# Patient Record
Sex: Male | Born: 1980 | Race: White | Hispanic: No | Marital: Married | Smoking: Never smoker
Health system: Southern US, Community
[De-identification: ages and names within clinical notes are randomized; demographics above are authoritative.]

## PROBLEM LIST (undated history)

## (undated) DIAGNOSIS — Z940 Kidney transplant status: Principal | ICD-10-CM

## (undated) DIAGNOSIS — R809 Proteinuria, unspecified: Secondary | ICD-10-CM

## (undated) DIAGNOSIS — N189 Chronic kidney disease, unspecified: Secondary | ICD-10-CM

## (undated) DIAGNOSIS — H409 Unspecified glaucoma: Secondary | ICD-10-CM

## (undated) DIAGNOSIS — Z8679 Personal history of other diseases of the circulatory system: Secondary | ICD-10-CM

## (undated) DIAGNOSIS — E559 Vitamin D deficiency, unspecified: Secondary | ICD-10-CM

## (undated) DIAGNOSIS — G63 Polyneuropathy in diseases classified elsewhere: Secondary | ICD-10-CM

## (undated) HISTORY — DX: Kidney transplant status: Z94.0

## (undated) HISTORY — DX: Chronic kidney disease, unspecified: N18.9

## (undated) HISTORY — DX: Proteinuria, unspecified: R80.9

## (undated) HISTORY — DX: Polyneuropathy in diseases classified elsewhere: G63

## (undated) HISTORY — DX: Personal history of other diseases of the circulatory system: Z86.79

## (undated) HISTORY — DX: Vitamin D deficiency, unspecified: E55.9

## (undated) HISTORY — DX: Unspecified glaucoma: H40.9

---

## 2017-04-25 ENCOUNTER — Ambulatory Visit (INDEPENDENT_AMBULATORY_CARE_PROVIDER_SITE_OTHER): Payer: BLUE CROSS/BLUE SHIELD | Admitting: Osteopathic Medicine

## 2017-04-25 ENCOUNTER — Encounter: Payer: Self-pay | Admitting: Osteopathic Medicine

## 2017-04-25 VITALS — BP 126/77 | HR 62 | Ht 68.9 in | Wt 175.0 lb

## 2017-04-25 DIAGNOSIS — G63 Polyneuropathy in diseases classified elsewhere: Secondary | ICD-10-CM | POA: Diagnosis not present

## 2017-04-25 DIAGNOSIS — E559 Vitamin D deficiency, unspecified: Secondary | ICD-10-CM | POA: Insufficient documentation

## 2017-04-25 DIAGNOSIS — N189 Chronic kidney disease, unspecified: Secondary | ICD-10-CM | POA: Diagnosis not present

## 2017-04-25 DIAGNOSIS — H409 Unspecified glaucoma: Secondary | ICD-10-CM | POA: Diagnosis not present

## 2017-04-25 DIAGNOSIS — Z940 Kidney transplant status: Secondary | ICD-10-CM

## 2017-04-25 DIAGNOSIS — Z8679 Personal history of other diseases of the circulatory system: Secondary | ICD-10-CM | POA: Diagnosis not present

## 2017-04-25 DIAGNOSIS — R809 Proteinuria, unspecified: Secondary | ICD-10-CM

## 2017-04-25 HISTORY — DX: Chronic kidney disease, unspecified: N18.9

## 2017-04-25 HISTORY — DX: Personal history of other diseases of the circulatory system: Z86.79

## 2017-04-25 HISTORY — DX: Proteinuria, unspecified: R80.9

## 2017-04-25 HISTORY — DX: Vitamin D deficiency, unspecified: E55.9

## 2017-04-25 HISTORY — DX: Unspecified glaucoma: H40.9

## 2017-04-25 HISTORY — DX: Kidney transplant status: Z94.0

## 2017-04-25 MED ORDER — DORZOLAMIDE HCL-TIMOLOL MAL 2-0.5 % OP SOLN: 1.0000 [drp] | Freq: Two times a day (BID) | OPHTHALMIC | 3 refills | Status: DC

## 2017-04-25 NOTE — Progress Notes (Signed)
HPI: Martin Wu is a 36 y.o. male  who presents [name] today, 04/25/17,  for chief complaint of:  Chief Complaint  Patient presents with  . Establish Care    New here to establish - moved from [place], originally from [place] Takes care of wife, Martin RipperClaire.   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   HTN - ok on current medications, no home BP to report, on meds more for renal protection   Hc CKD - last Cr 1.92, gets levels checked every few months.   Hx Glaucoma - has appt w/ ophthalmologist upcoming  (+)FH Colon cancer - mom dx and deceased at age 37    Past medical, surgical, social and family history reviewed: Patient Active Problem List   Diagnosis Date Noted  . 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 History of high blood pressure 04/25/2017  . Proteinuria 04/25/2017  . Vitamin D deficiency 04/25/2017  . Glaucoma of both eyes 04/25/2017  . 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 Social History  Substance Use Topics  . Smoking status: Not on file  . Smokeless tobacco: Not on file  . Alcohol use Not on file   No family history on file.   Current medication list and allergy/intolerance information reviewed:   Current Outpatient Prescriptions  Medication Sig Dispense Refill  . dorzolamide-timolol (COSOPT) 22.3-6.8 MG/ML ophthalmic solution Place 1 drop into both eyes 2 (two) times daily. 10 mL 3  . losartan (COZAAR) 25 MG tablet Take 25 mg by mouth daily.    . mycophenolate (CELLCEPT) 500 MG tablet Take by mouth 2 (two) times daily.    . tacrolimus (PROGRAF) 1 MG capsule Take 2 mg by mouth 2 (two) times daily.    . Vitamin D, Ergocalciferol, (DRISDOL) 50000 units CAPS  capsule Take 50,000 Units by mouth every 7 (seven) days.     No current facility-administered medications for this visit.    Allergies not on file    Review of Systems:  Constitutional:  No  fever, no chills, No recent illness, No unintentional weight changes. No significant fatigue.   HEENT: No  headache, no vision change  Cardiac: No  chest pain, No  pressure, No palpitations  Respiratory:  No  shortness of breath. No  Cough  Gastrointestinal: No  abdominal pain, No  nausea, No  vomiting,  No  blood in stool, No  diarrhea, No  constipation   Musculoskeletal: No new myalgia/arthralgia  Skin: No  Rash  Hem/Onc: No  easy bruising/bleeding  Endocrine: No cold intolerance,  No heat intolerance.  Neurologic: No  weakness, No  dizziness,  Psychiatric: No  concerns with depression, No  concerns with anxiety, No sleep problems, No mood problems  Exam:  BP 126/77   Pulse 62   Ht 5' 8.9" (1.75 m)   Wt 175 lb (79.4 kg)   SpO2 100%   BMI 25.92 kg/m   Constitutional: VS see above. General Appearance: alert, well-developed, well-nourished, NAD  Eyes: Normal lids and conjunctive, non-icteric sclera  Ears, Nose, Mouth, Throat: MMM, Normal external inspection ears/nares/mouth/lips/gums. T  Neck: No masses, trachea midline.   Respiratory: Normal respiratory effort. no wheeze, no rhonchi, no rales  Cardiovascular: S1/S2 normal, no murmur, no rub/gallop auscultated. RRR.  No lower extremity edema.   Musculoskeletal: Gait normal. No clubbing/cyanosis of digits.   Neurological: Normal balance/coordination. No tremor.   Skin: warm, dry, intact.  Psychiatric: Normal judgment/insight. Normal mood and affect. Oriented x3.       ASSESSMENT/PLAN:   Renal transplant recipient - Records requested - Plan: Ambulatory referral to Nephrology, COMPLETE METABOLIC PANEL WITH GFR, Urinalysis, Routine w reflex microscopic, Tacrolimus Level  History of high blood pressure - Plan: COMPLETE  METABOLIC PANEL WITH GFR  Proteinuria, unspecified type - Plan: Urinalysis, Routine w reflex microscopic  Uremic neuropathy (HCC) - previously did well with PT, may request referral in the future  Vitamin D deficiency  Glaucoma of both eyes, unspecified glaucoma type    Patient Instructions  Thanks for coming in today!   Plan:  Nephrology referral is in place  Ophthalmology referral - let us know if you need one!   Labs as directed  Refills should be sent - let us know if any problems       Visit summary with medication list and pertinent instructions was printed for patient to review. All questions at time of visit were answered - patient instructed to contact office with any additional concerns. ER/RTC precautions were reviewed with the patient. Follow-up plan: Return in about 6 months (around 10/26/2017) for [facility] PHYSICAL - sooner if needed! Marland Kitchen

## 2017-04-25 NOTE — Patient Instructions (Signed)
Thanks for coming in today!   Plan:  Nephrology referral is in place  Ophthalmology referral - let us know if you need one!   Labs as directed  Refills should be sent - let us know if any problems

## 2017-05-03 ENCOUNTER — Telehealth: Payer: Self-pay | Admitting: Osteopathic Medicine

## 2017-05-03 DIAGNOSIS — R809 Proteinuria, unspecified: Secondary | ICD-10-CM

## 2017-05-03 DIAGNOSIS — N189 Chronic kidney disease, unspecified: Principal | ICD-10-CM

## 2017-05-03 DIAGNOSIS — Z940 Kidney transplant status: Secondary | ICD-10-CM

## 2017-05-03 DIAGNOSIS — G63 Polyneuropathy in diseases classified elsewhere: Secondary | ICD-10-CM

## 2017-05-03 LAB — COMPLETE METABOLIC PANEL WITH GFR
ALT: 18 U/L (ref 9–46)
AST: 18 U/L (ref 10–40)
Albumin: 4.3 g/dL (ref 3.6–5.1)
Alkaline Phosphatase: 91 U/L (ref 40–115)
BUN: 37 mg/dL — ABNORMAL HIGH (ref 7–25)
CALCIUM: 9.3 mg/dL (ref 8.6–10.3)
CHLORIDE: 104 mmol/L (ref 98–110)
CO2: 20 mmol/L (ref 20–31)
Creat: 2.03 mg/dL — ABNORMAL HIGH (ref 0.60–1.35)
GFR, EST AFRICAN AMERICAN: 47 mL/min — AB (ref >=60)
GFR, EST NON AFRICAN AMERICAN: 41 mL/min — AB (ref >=60)
Glucose, Bld: 76 mg/dL (ref 65–99)
POTASSIUM: 5.4 mmol/L — AB (ref 3.5–5.3)
Sodium: 131 mmol/L — ABNORMAL LOW (ref 135–146)
Total Bilirubin: 0.8 mg/dL (ref 0.2–1.2)
Total Protein: 6.7 g/dL (ref 6.1–8.1)

## 2017-05-03 NOTE — Telephone Encounter (Signed)
Noted! Will work on this to clarify instructions in the future and let patients know what to expect

## 2017-05-03 NOTE — Telephone Encounter (Signed)
Can we call lab for clarification: I ordered these labs standing and should have been set to release automatically. Is that something we aren't able to do? How to we place standing orders so the specimens can actually get collected and processed?

## 2017-05-03 NOTE — Telephone Encounter (Signed)
Standing orders must be released. Sorry.

## 2017-05-04 ENCOUNTER — Other Ambulatory Visit: Payer: Self-pay | Admitting: *Deleted

## 2017-05-04 LAB — URINALYSIS, ROUTINE W REFLEX MICROSCOPIC
BILIRUBIN URINE: NEGATIVE
GLUCOSE, UA: NEGATIVE
HGB URINE DIPSTICK: NEGATIVE
KETONES UR: NEGATIVE
Leukocytes, UA: NEGATIVE
NITRITE: NEGATIVE
PH: 5 (ref 5.0–8.0)
Specific Gravity, Urine: 1.009 (ref 1.001–1.035)

## 2017-05-04 LAB — URINALYSIS, MICROSCOPIC ONLY
BACTERIA UA: NONE SEEN [HPF]
Casts: NONE SEEN [LPF]
Crystals: NONE SEEN [HPF]
SQUAMOUS EPITHELIAL / LPF: NONE SEEN [HPF] (ref <=5)
WBC UA: NONE SEEN WBC/HPF (ref <=5)
Yeast: NONE SEEN [HPF]

## 2017-05-04 LAB — TACROLIMUS LEVEL: TACROLIMUS LVL: 3.5 ng/mL — AB (ref 5.0–20.0)

## 2017-05-04 MED ORDER — MYCOPHENOLATE MOFETIL 500 MG PO TABS: 500.0000 mg | ORAL_TABLET | Freq: Two times a day (BID) | ORAL | 1 refills | Status: DC

## 2017-05-04 MED ORDER — TACROLIMUS 1 MG PO CAPS: 2.0000 mg | ORAL_CAPSULE | Freq: Two times a day (BID) | ORAL | 1 refills | Status: DC

## 2017-05-04 NOTE — Telephone Encounter (Signed)
Martin Wu, this patient dropped off a no card with his medications in the fax number for work with a needed to be sent, did we take care of this?

## 2017-05-05 ENCOUNTER — Telehealth: Payer: Self-pay

## 2017-05-05 MED ORDER — LOSARTAN POTASSIUM 50 MG PO TABS: 25.0000 mg | ORAL_TABLET | Freq: Every day | ORAL | 1 refills | Status: AC

## 2017-05-05 NOTE — Telephone Encounter (Signed)
No need to do the vitamin D, he just got a prescription about 10 days ago. It's meant to last 8 weeks, once a week.

## 2017-05-05 NOTE — Telephone Encounter (Signed)
Patient needs refills on Losartan and Vitamin D. Please advise. Historical Provider.

## 2017-05-08 ENCOUNTER — Other Ambulatory Visit: Payer: Self-pay

## 2017-05-08 MED ORDER — TACROLIMUS 1 MG PO CAPS: 2.0000 mg | ORAL_CAPSULE | Freq: Two times a day (BID) | ORAL | 1 refills | Status: DC

## 2017-05-08 MED ORDER — MYCOPHENOLATE MOFETIL 500 MG PO TABS: 500.0000 mg | ORAL_TABLET | Freq: Two times a day (BID) | ORAL | 1 refills | Status: DC

## 2017-05-09 ENCOUNTER — Other Ambulatory Visit: Payer: Self-pay

## 2017-05-09 MED ORDER — MYCOPHENOLATE MOFETIL 500 MG PO TABS: 500.0000 mg | ORAL_TABLET | Freq: Two times a day (BID) | ORAL | 1 refills | Status: DC

## 2017-05-09 MED ORDER — MYCOPHENOLATE MOFETIL 500 MG PO TABS: 500.0000 mg | ORAL_TABLET | Freq: Two times a day (BID) | ORAL | 0 refills | Status: DC

## 2017-05-09 MED ORDER — TACROLIMUS 1 MG PO CAPS: 2.0000 mg | ORAL_CAPSULE | Freq: Two times a day (BID) | ORAL | 0 refills | Status: DC

## 2017-05-09 MED ORDER — TACROLIMUS 1 MG PO CAPS: 2.0000 mg | ORAL_CAPSULE | Freq: Two times a day (BID) | ORAL | 1 refills | Status: DC

## 2017-05-10 ENCOUNTER — Other Ambulatory Visit: Payer: Self-pay | Admitting: Osteopathic Medicine

## 2017-05-10 DIAGNOSIS — E870 Hyperosmolality and hypernatremia: Secondary | ICD-10-CM

## 2017-05-10 DIAGNOSIS — Z940 Kidney transplant status: Secondary | ICD-10-CM

## 2017-05-10 NOTE — Progress Notes (Signed)
Orders placed per PCP recommendation.

## 2017-05-11 LAB — COMPLETE METABOLIC PANEL WITH GFR
ALT: 16 U/L (ref 9–46)
AST: 17 U/L (ref 10–40)
Albumin: 4.3 g/dL (ref 3.6–5.1)
Alkaline Phosphatase: 89 U/L (ref 40–115)
BILIRUBIN TOTAL: 0.5 mg/dL (ref 0.2–1.2)
BUN: 43 mg/dL — ABNORMAL HIGH (ref 7–25)
CO2: 18 mmol/L — AB (ref 20–31)
Calcium: 9 mg/dL (ref 8.6–10.3)
Chloride: 102 mmol/L (ref 98–110)
Creat: 2.22 mg/dL — ABNORMAL HIGH (ref 0.60–1.35)
GFR, EST AFRICAN AMERICAN: 42 mL/min — AB (ref >=60)
GFR, EST NON AFRICAN AMERICAN: 36 mL/min — AB (ref >=60)
GLUCOSE: 87 mg/dL (ref 65–99)
POTASSIUM: 4.7 mmol/L (ref 3.5–5.3)
SODIUM: 133 mmol/L — AB (ref 135–146)
Total Protein: 6.4 g/dL (ref 6.1–8.1)

## 2017-05-11 LAB — TACROLIMUS LEVEL: Tacrolimus Lvl: 3.8 ng/mL — ABNORMAL LOW (ref 5.0–20.0)

## 2017-05-16 ENCOUNTER — Ambulatory Visit (INDEPENDENT_AMBULATORY_CARE_PROVIDER_SITE_OTHER): Payer: BLUE CROSS/BLUE SHIELD | Admitting: Osteopathic Medicine

## 2017-05-16 ENCOUNTER — Encounter: Payer: Self-pay | Admitting: Osteopathic Medicine

## 2017-05-16 VITALS — BP 144/70 | HR 82 | Resp 18 | Wt 170.0 lb

## 2017-05-16 DIAGNOSIS — R1310 Dysphagia, unspecified: Secondary | ICD-10-CM

## 2017-05-16 DIAGNOSIS — R0989 Other specified symptoms and signs involving the circulatory and respiratory systems: Secondary | ICD-10-CM

## 2017-05-16 DIAGNOSIS — R5383 Other fatigue: Secondary | ICD-10-CM | POA: Diagnosis not present

## 2017-05-16 DIAGNOSIS — F458 Other somatoform disorders: Secondary | ICD-10-CM

## 2017-05-16 DIAGNOSIS — Z940 Kidney transplant status: Secondary | ICD-10-CM | POA: Diagnosis not present

## 2017-05-16 MED ORDER — VITAMIN D (ERGOCALCIFEROL) 1.25 MG (50000 UNIT) PO CAPS: 50000.0000 [IU] | ORAL_CAPSULE | ORAL | 1 refills | Status: AC

## 2017-05-16 MED ORDER — AMOXICILLIN-POT CLAVULANATE 875-125 MG PO TABS: 1.0000 | ORAL_TABLET | Freq: Two times a day (BID) | ORAL | 0 refills | Status: DC

## 2017-05-16 MED ORDER — FLUCONAZOLE 150 MG PO TABS: 150.0000 mg | ORAL_TABLET | Freq: Once | ORAL | 1 refills | Status: AC

## 2017-05-16 NOTE — Progress Notes (Signed)
HPI: Martin Wu is a 36 y.o. male  who presents to [name] today, 05/16/17,  for chief complaint of:  Chief Complaint  Patient presents with  . Follow-up    Concerned about possible transplant reduction  . Sore Throat   Renal transplant patient. I had ordered his routine labs for kidney function monitoring and tacrolimus level as a courtesy prior to upcoming appointment with renal transplant team, however he was not able to get in with renal transplant team for some time, he recently relocated to this area. Currently he is concerned about Tacrolimus levels being below range. He increased dose on his own for a few days but went back down to 2 mg twice a day.  He recently went to the emergency department 05/14/2017 for concern about fatigue. He is concerned about generalized immunosuppression and possible transplant rejection. Creatinine 2.08 at that point, sodium 125.   He does not have an appointment with nephrology until 06/15/2017.  At this point, another concern includes discomfort/pain with swallowing. No mouth or tongue pain, mild dental pain but no gum abnormality. He is concerned about possible thrush down the throat/esophagus.   CT abdomen pelvis without contrast performed in ER: IMPRESSION:  Bilateral native kidneys are markedly atrophic. 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 There is a left pelvic transplant kidney with mild hydronephrosis. No  obstructing calculi. No significant perinephric stranding.   Past medical, surgical, social and family history reviewed: Patient Active Problem List   Diagnosis Date Noted  . 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 History of high blood pressure 04/25/2017  . Proteinuria 04/25/2017  . Vitamin D deficiency 04/25/2017  . Glaucoma of both eyes 04/25/2017  . 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 Social History  Substance Use Topics  . Smoking status: Never Smoker  . Smokeless tobacco: Never Used  . Alcohol use No   No family history on file.   Current medication list and allergy/intolerance information reviewed:   Current Outpatient Prescriptions  Medication Sig Dispense Refill  . dorzolamide-timolol (COSOPT) 22.3-6.8 MG/ML ophthalmic solution Place 1 drop into both eyes 2 (two) times daily. 10 mL 3  . losartan (COZAAR) 50 MG tablet Take 0.5 tablets (25 mg total) by mouth daily. 30 tablet 1  . mycophenolate (CELLCEPT) 500 MG tablet Take 1 tablet (500 mg total) by mouth 2 (two) times daily. 60 tablet 0  . tacrolimus (PROGRAF) 1 MG capsule Take 2 capsules (2 mg total) by mouth 2 (two) times daily. 120 capsule 0  . Vitamin D, Ergocalciferol, (DRISDOL) 50000 units CAPS capsule Take 50,000 Units by mouth every 7 (seven) days.     No current facility-administered medications for this visit.    No Known Allergies    Review of Systems:  Constitutional:  No  fever, no chills, +recent illness, No unintentional weight changes. +significant fatigue.   HEENT: No  headache, no vision change  Cardiac: No  chest pain, No  pressure  Respiratory:  No  shortness of breath. No  Cough  Gastrointestinal: No  abdominal pain, No  nausea, No  vomiting,  No  blood in stool, No  diarrhea, No  constipation   Musculoskeletal: No new myalgia/arthralgia  Skin: No  Rash  Neurologic: No  weakness, No  dizziness   Exam:  BP (!) 144/70   Pulse 82   Resp 18   Wt 170  lb (77.1 kg)   BMI 25.18 kg/m    Constitutional: VS see above. General Appearance: alert, well-developed, well-nourished, NAD  Eyes: Normal lids and conjunctive, non-icteric sclera  Ears, Nose, Mouth, Throat: MMM, Normal external inspection ears/nares/mouth/lips/gums.  Pharynx/tonsils no erythema, no exudate. Nasal mucosa normal.   Neck: No masses, trachea midline. No thyroid enlargement. No  tenderness/mass appreciated. No lymphadenopathy  Respiratory: Normal respiratory effort. no wheeze, no rhonchi, no rales  Cardiovascular: S1/S2 normal, no murmur, no rub/gallop auscultated. RRR. No lower extremity edema.   Musculoskeletal: Gait normal.  Neurological: Normal balance/coordination. No tremor. Skin: warm, dry, intact. No rash/ulcer.  Psychiatric: Normal judgment/insight. Normal mood and affect. Oriented x3.   Recent labs reviewed from here and from emergency department visit, no concerns.  ASSESSMENT/PLAN:   Concerned about possible underlying infection or organ rejection. Based on labs, I have no serious concerns that he really needs management with transplant team especially since tacrolimus levels have been on the low/subtherapeutic side.  Patient has been instructed to call the office where he has an appointment set up to receive transplant care, if they're not able to get him in to see him sooner, he will let me know and I will see if I can call over to get some guidance on adjusting the tacrolimus or other lab orders that may need to be done  Nothing I can see to give great concern for pharyngeal pathology that would explain sore throat/dysphagia/globus symptoms. We'll refer to ENT, patient has been to ENT before for similar symptoms and undergone scope in the office. I think at this point, wouldn't be unreasonable to go ahead and treat for possible strep or fungal infection given his immunocompromise.  Renal transplant recipient - Plan: BASIC METABOLIC PANEL WITH GFR, Epstein-Barr virus VCA antibody panel, CBC with Differential/Platelet  Fatigue, unspecified type - Plan: Epstein-Barr virus VCA antibody panel, TSH  Globus sensation - Plan: Ambulatory referral to ENT  Dysphagia, unspecified type - Plan: Ambulatory referral to ENT    Visit summary with medication list and pertinent instructions was printed for patient to review. All questions at time of visit were  answered - patient instructed to contact office with any additional concerns. ER/RTC precautions were reviewed with the patient. Follow-up plan: Return if symptoms worsen or fail to improve.  Note: Total time spent 25 minutes, greater than 50% of the visit was spent face-to-face counseling and coordinating care for the following: The primary encounter diagnosis was Renal transplant recipient. Diagnoses of Fatigue, unspecified type, Globus sensation, and Dysphagia, unspecified type were also pertinent to this visit.Marland Kitchen

## 2017-05-17 LAB — EPSTEIN-BARR VIRUS VCA ANTIBODY PANEL
EBV NA IgG: 25.7 U/mL — ABNORMAL HIGH
EBV VCA IGG: 232 U/mL — AB

## 2017-05-17 LAB — BASIC METABOLIC PANEL WITH GFR
BUN: 40 mg/dL — AB (ref 7–25)
CHLORIDE: 104 mmol/L (ref 98–110)
CO2: 17 mmol/L — AB (ref 20–32)
Calcium: 9.6 mg/dL (ref 8.6–10.3)
Creat: 1.96 mg/dL — ABNORMAL HIGH (ref 0.60–1.35)
GFR, Est African American: 49 mL/min — ABNORMAL LOW (ref >=60)
GFR, Est Non African American: 42 mL/min — ABNORMAL LOW (ref >=60)
Glucose, Bld: 78 mg/dL (ref 65–99)
POTASSIUM: 4.7 mmol/L (ref 3.5–5.3)
SODIUM: 135 mmol/L (ref 135–146)

## 2017-05-17 LAB — CBC WITH DIFFERENTIAL/PLATELET
Basophils Absolute: 0 cells/uL (ref 0–200)
Basophils Relative: 0 %
EOS PCT: 1 %
Eosinophils Absolute: 65 cells/uL (ref 15–500)
HCT: 36 % — ABNORMAL LOW (ref 38.5–50.0)
Hemoglobin: 11.7 g/dL — ABNORMAL LOW (ref 13.2–17.1)
LYMPHS PCT: 20 %
Lymphs Abs: 1300 cells/uL (ref 850–3900)
MCH: 28 pg (ref 27.0–33.0)
MCHC: 32.5 g/dL (ref 32.0–36.0)
MCV: 86.1 fL (ref 80.0–100.0)
MPV: 10.8 fL (ref 7.5–12.5)
Monocytes Absolute: 520 cells/uL (ref 200–950)
Monocytes Relative: 8 %
NEUTROS PCT: 71 %
Neutro Abs: 4615 cells/uL (ref 1500–7800)
PLATELETS: 187 10*3/uL (ref 140–400)
RBC: 4.18 MIL/uL — AB (ref 4.20–5.80)
RDW: 14.3 % (ref 11.0–15.0)
WBC: 6.5 10*3/uL (ref 3.8–10.8)

## 2017-05-17 LAB — TSH: TSH: 0.61 m[IU]/L (ref 0.40–4.50)

## 2017-05-30 ENCOUNTER — Ambulatory Visit (INDEPENDENT_AMBULATORY_CARE_PROVIDER_SITE_OTHER): Payer: BLUE CROSS/BLUE SHIELD | Admitting: Osteopathic Medicine

## 2017-05-30 ENCOUNTER — Encounter: Payer: Self-pay | Admitting: Osteopathic Medicine

## 2017-05-30 ENCOUNTER — Ambulatory Visit (INDEPENDENT_AMBULATORY_CARE_PROVIDER_SITE_OTHER): Payer: BLUE CROSS/BLUE SHIELD

## 2017-05-30 VITALS — BP 142/92 | HR 70 | Ht 70.0 in | Wt 169.0 lb

## 2017-05-30 DIAGNOSIS — M79675 Pain in left toe(s): Secondary | ICD-10-CM | POA: Diagnosis not present

## 2017-05-30 DIAGNOSIS — S99922A Unspecified injury of left foot, initial encounter: Secondary | ICD-10-CM

## 2017-05-30 DIAGNOSIS — S92425A Nondisplaced fracture of distal phalanx of left great toe, initial encounter for closed fracture: Secondary | ICD-10-CM

## 2017-05-30 NOTE — Progress Notes (Signed)
HPI: Martin Wu is a 36 y.o. male  who presents to [facility] today, 05/30/17,  for chief complaint of:  Chief Complaint  Patient presents with  . Toe Injury    left big toe    . Hx neuropathy and limited dorsiflexion of L foot/toes anyway d/t uremic neuropathy. Hyperflexed great toe last night getting out of bed - bleeding at toe nail bed and some swelling there, he felt a pop at time of injury. He is a little concerned about compromised circulation if swelling persists  . Location: L great toe at base of nail . Quality: sore, swollen, some bleeding at nail bed . Assoc signs/symptoms: see above- neuropathy and weakness is chronic     Past medical history, surgical history, social history and family history reviewed.  Patient Active Problem List   Diagnosis Date Noted  . Fatigue 05/16/2017  . 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 History of high blood pressure 04/25/2017  . Proteinuria 04/25/2017  . Vitamin D deficiency 04/25/2017  . Glaucoma of both eyes 04/25/2017  . 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   Current Outpatient Prescriptions on File Prior to Visit  Medication Sig Dispense Refill  . dorzolamide-timolol (COSOPT) 22.3-6.8 MG/ML ophthalmic solution Place 1 drop into both eyes 2 (two) times daily. 10 mL 3  . losartan (COZAAR) 50 MG tablet Take 0.5 tablets (25 mg total) by mouth daily. 30 tablet 1  . mycophenolate (CELLCEPT) 500 MG tablet Take 1 tablet (500 mg total) by mouth 2 (two) times daily. 60 tablet 0  . tacrolimus (PROGRAF) 1 MG capsule Take 2 capsules (2 mg total) by mouth 2 (two) times daily. 120 capsule 0  . Vitamin D, Ergocalciferol, (DRISDOL) 50000 units CAPS capsule Take 1 capsule (50,000 Units total) by mouth every 7 (seven) days. 30 capsule 1   No current facility-administered medications on file prior to visit.    No Known Allergies     Review of Systems:  Constitutional: No recent illness  Cardiac: No  chest pain  Respiratory:  No  shortness of breath  Musculoskeletal: No new myalgia/arthralgia except as per HPI  Skin: No  Rash or abrasion   Neurologic: No  weakness, No  Dizziness   Exam:  BP (!) 142/92   Pulse 70   Ht 5\' 10"  (1.778 m)   Wt 169 lb (76.7 kg)   BMI 24.25 kg/m   Constitutional: VS see above. General Appearance: alert, well-developed, well-nourished, NAD  Eyes: Normal lids and conjunctive, non-icteric sclera  Ears, Nose, Mouth, Throat: MMM, Normal external inspection ears/nares/mouth/lips/gums.  Neck: No masses, trachea midline.   Respiratory: Normal respiratory effort. no wheeze, no rhonchi, no rales  Cardiovascular: S1/S2 normal, no murmur, no rub/gallop auscultated. RRR.   Musculoskeletal: Gait normal. Ecchymosis at base of L great toe nail surrounding skin but nail itself is normal. Normal passive ROM, active ROM shows limited dorsiflexion of ankle and great toe which patient states is chronic. No toe drop - in neutral position the great toe is parallel with the bottom of the foot.   Neurological: Normal balance/coordination. No tremor.  Skin: warm, dry, intact.   Psychiatric: Normal judgment/insight. Normal mood and affect. Oriented x3.    XR personally reviewed.   Dg Toe Great Left  Result Date: 05/30/2017 CLINICAL DATA:  Left great toe pain, bruising.  Injury 1 day ago. EXAM: LEFT GREAT TOE COMPARISON:  None. FINDINGS: Linear lucency near  the base of the left great toe distal phalanx concerning for nondisplaced fracture which likely extends into the IP joint. No subluxation or dislocation. IMPRESSION: Concern for nondisplaced fracture through the base of the left great toe distal phalanx. Electronically Signed   By: Charlett Nose M.D.   On: 05/30/2017 09:07    ASSESSMENT/PLAN:   Initially, thought week. Get away with found immobilization, per patient preference, but given  the likelihood of fracture based on radiology over-read, called patient to let him know would probably recommend immobilization in a postop shoe. Advised him to schedule nurse visit to pick this up and make sure it fits. Left voice mail.  Injury of toe on left foot, initial encounter - Plan: DG Toe Great Left  Closed nondisplaced fracture of distal phalanx of left great toe, initial encounter      Follow-up plan: Return in about 4 weeks (around 06/27/2017) for recheck foot .  Visit summary with medication list and pertinent instructions was printed for patient to review, alert [place] if any changes needed. All questions at time of visit were answered - patient instructed to contact office with any additional concerns. ER/RTC precautions were reviewed with the patient and understanding verbalized.

## 2017-05-30 NOTE — Patient Instructions (Signed)
Plan: Immobilization is an option, but if you are doing well with supportive shoes, that is probably sufficient. If toe is dropping, we will NEED to immobilize it and get MRI and consider Orthopedic referral.

## 2017-06-01 ENCOUNTER — Ambulatory Visit (INDEPENDENT_AMBULATORY_CARE_PROVIDER_SITE_OTHER): Payer: BLUE CROSS/BLUE SHIELD | Admitting: Osteopathic Medicine

## 2017-06-01 VITALS — BP 138/81 | HR 73 | Ht 70.0 in | Wt 168.0 lb

## 2017-06-01 DIAGNOSIS — S92425A Nondisplaced fracture of distal phalanx of left great toe, initial encounter for closed fracture: Secondary | ICD-10-CM

## 2017-06-01 NOTE — Progress Notes (Signed)
Continue with boot in place for 2 weeks and then reevaluate. May consider repeat x-ray, if feeling well after 2 weeks can remove boot.

## 2017-06-07 ENCOUNTER — Ambulatory Visit (INDEPENDENT_AMBULATORY_CARE_PROVIDER_SITE_OTHER): Payer: BLUE CROSS/BLUE SHIELD | Admitting: Osteopathic Medicine

## 2017-06-07 DIAGNOSIS — Z23 Encounter for immunization: Secondary | ICD-10-CM | POA: Diagnosis not present

## 2017-06-11 ENCOUNTER — Other Ambulatory Visit: Payer: Self-pay | Admitting: Osteopathic Medicine

## 2017-06-16 ENCOUNTER — Encounter: Payer: Self-pay | Admitting: Osteopathic Medicine

## 2017-06-16 ENCOUNTER — Ambulatory Visit (INDEPENDENT_AMBULATORY_CARE_PROVIDER_SITE_OTHER): Payer: BLUE CROSS/BLUE SHIELD | Admitting: Osteopathic Medicine

## 2017-06-16 VITALS — BP 126/84 | HR 80 | Ht 70.0 in | Wt 168.0 lb

## 2017-06-16 DIAGNOSIS — S92425A Nondisplaced fracture of distal phalanx of left great toe, initial encounter for closed fracture: Secondary | ICD-10-CM

## 2017-06-16 NOTE — Progress Notes (Signed)
HPI: Martin Wu is a 36 y.o. male  who presents to [facility] today, 05/30/17,  for chief complaint of:  Chief Complaint  Patient presents with  . Toe Injury    left big toe    . Hx neuropathy and limited dorsiflexion of L foot/toes anyway d/t uremic neuropathy. Hyperflexed great toe getting out of bed - X-ray demonstrated fracture and patient was placed in walking. Instructions to follow-up in 2 weeks. The walking boot for about 2 weeks, just under then decided to stop using it since he is feeling okay. States he is walking all right now, the toe is occasionally sore but really doesn't cause him any trouble . Location: L great toe  . Quality: sore, Swelling has improved, overall functioning fine . Assoc signs/symptoms: see above- neuropathy and weakness is chronic     Past medical history, surgical history, social history and family history reviewed.  Patient Active Problem List   Diagnosis Date Noted  . Fatigue 05/16/2017  . 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 History of high blood pressure 04/25/2017  . Proteinuria 04/25/2017  . Vitamin D deficiency 04/25/2017  . Glaucoma of both eyes 04/25/2017  . 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   Current Outpatient Prescriptions on File Prior to Visit  Medication Sig Dispense Refill  . dorzolamide-timolol (COSOPT) 22.3-6.8 MG/ML ophthalmic solution Place 1 drop into both eyes 2 (two) times daily. 10 mL 3  . losartan (COZAAR) 50 MG tablet Take 0.5 tablets (25 mg total) by mouth daily. 30 tablet 1  . mycophenolate (CELLCEPT) 500 MG tablet Take 1 tablet (500 mg total) by mouth 2 (two) times daily. 60 tablet 0  . tacrolimus (PROGRAF) 1 MG capsule Take 2 capsules (2 mg total) by mouth 2 (two) times daily. 120 capsule 0  . Vitamin D, Ergocalciferol, (DRISDOL) 50000 units CAPS capsule Take 1 capsule (50,000 Units total) by mouth  every 7 (seven) days. 30 capsule 1   No current facility-administered medications on file prior to visit.    No Known Allergies    Review of Systems:  Constitutional: No recent illness  Cardiac: No  chest pain  Respiratory:  No  shortness of breath  Musculoskeletal: No new myalgia/arthralgia except as per HPI  Skin: No  Rash or abrasion    Exam:  BP 126/84   Pulse 80   Ht  (1.778 m)   Wt 168 lb (76.2 kg)   BMI 24.11 kg/m   Constitutional: VS see above. General Appearance: alert, well-developed, well-nourished, NAD  Eyes: Normal lids and conjunctive, non-icteric sclera  Ears, Nose, Mouth, Throat: MMM, Normal external inspection ears/nares/mouth/lips/gums.  Neck: No masses, trachea midline.   Respiratory: Normal respiratory effort. no wheeze, no rhonchi, no rales  Cardiovascular: S1/S2 normal, no murmur, no rub/gallop auscultated. RRR.   Musculoskeletal: Gait normal. Ecchymosis at base of L great toe nail surrounding skin has resolved. Normal passive ROM, active ROM shows limited dorsiflexion of ankle and great toe which patient states is chronic. No toe drop - in neutral position the great toe is parallel with the bottom of the foot.   Neurological: Normal balance/coordination. No tremor.  Skin: warm, dry, intact.   Psychiatric: Normal judgment/insight. Normal mood and affect. Oriented x3.    XR personally reviewed.   Dg Toe Great Left  Result Date: 05/30/2017 CLINICAL DATA:  Left great toe pain, bruising.  Injury 1 day ago. EXAM: LEFT GREAT  TOE COMPARISON:  None. FINDINGS: Linear lucency near the base of the left great toe distal phalanx concerning for nondisplaced fracture which likely extends into the IP joint. No subluxation or dislocation. IMPRESSION: Concern for nondisplaced fracture through the base of the left great toe distal phalanx. Electronically Signed   By: Charlett NoseKevin  Dover M.D.   On: 05/30/2017 09:07    ASSESSMENT/PLAN:   Closed nondisplaced  fracture of distal phalanx of left great toe, initial encounter: Patient feels well outside of the food with occasional soreness of the toe. Able to move the toe and bear weight. Advised option to buddy tape and repeat x-ray to ensure healing, see me sooner if any questions or concerns. Patient is not overly worried about the fracture since his functionality is about as good as it was prior to the injury     Visit summary with medication list and pertinent instructions was printed for patient to review, alert us if any changes needed. All questions at time of visit were answered - patient instructed to contact office with any additional concerns. ER/RTC precautions were reviewed with the patient and understanding verbalized.

## 2017-06-28 ENCOUNTER — Ambulatory Visit: Payer: BLUE CROSS/BLUE SHIELD | Admitting: Osteopathic Medicine

## 2017-07-11 ENCOUNTER — Telehealth: Payer: Self-pay | Admitting: Osteopathic Medicine

## 2017-07-11 NOTE — Telephone Encounter (Signed)
I think this may have been something the pharmacy sent? Form asked me to list all of his medications. If he is not aware of any form, I would have him come by and take a look at it to make sure it's fine for [place] to send.

## 2017-07-11 NOTE — Telephone Encounter (Signed)
I called patient. He is unaware of any forms he needs to pick up. Please advise.

## 2017-07-11 NOTE — Telephone Encounter (Signed)
Left forearm in you're in basket to complete. I filled out the medications and signed it just needs our office and so. When finished, can leave up front and please call patient to let him know when it is ready. He has a portion he has to fill out to complete

## 2017-07-13 NOTE — Telephone Encounter (Signed)
Patient has no idea what form it may be. He states he is ok if it is from his specialty pharmacy needing refills. He did want me to let you know that you have been a great doctor but he may not be moving here after all.

## 2017-07-13 NOTE — Telephone Encounter (Signed)
OK I found the form, it was in my office. I put it in your inbasket for real this time. Please call and let him know.  Will be sad if he leaves [place], but understood!

## 2017-07-13 NOTE — Telephone Encounter (Signed)
Called the patient and advised him that form was filled out and faxed to [facility] and placed up front for pickup. Rhonda Cunningham,CMA

## 2017-07-21 ENCOUNTER — Ambulatory Visit: Payer: BLUE CROSS/BLUE SHIELD | Admitting: Osteopathic Medicine

## 2017-08-10 ENCOUNTER — Telehealth: Payer: Self-pay | Admitting: Osteopathic Medicine

## 2017-08-10 NOTE — Telephone Encounter (Signed)
Please call patient: I got a request for alternative to tacrolimus/Prograf - it is out of stock/on back order w/ [facility].  Form is asking for a change of medication. I would recommend he contact his nephrology team or we should send tacrolimus to alternative pharmacy.  Clinical note: Not being a nephrologist, I do not feel qualified to change his medications entirely. Awhile back I also received a message that he may be moving, I do not believe he followed up with a nephrologist here locally.

## 2017-08-14 NOTE — Telephone Encounter (Signed)
Spoke with pt today.  He is actually back in [place] and his providers up there are handling the Prograf.

## 2017-09-30 ENCOUNTER — Other Ambulatory Visit: Payer: Self-pay | Admitting: Osteopathic Medicine

## 2017-10-26 ENCOUNTER — Encounter: Payer: BLUE CROSS/BLUE SHIELD | Admitting: Osteopathic Medicine

## 2017-10-26 DIAGNOSIS — Z0189 Encounter for other specified special examinations: Secondary | ICD-10-CM

## 2017-11-26 IMAGING — DX DG TOE GREAT 2+V*L*
3 series · 3 of 3 positions shown · non-contrast
Comparison: None.

CLINICAL DATA: Left great toe pain, bruising.  Injury 1 day ago.

EXAM:
LEFT GREAT TOE

[toe ap]
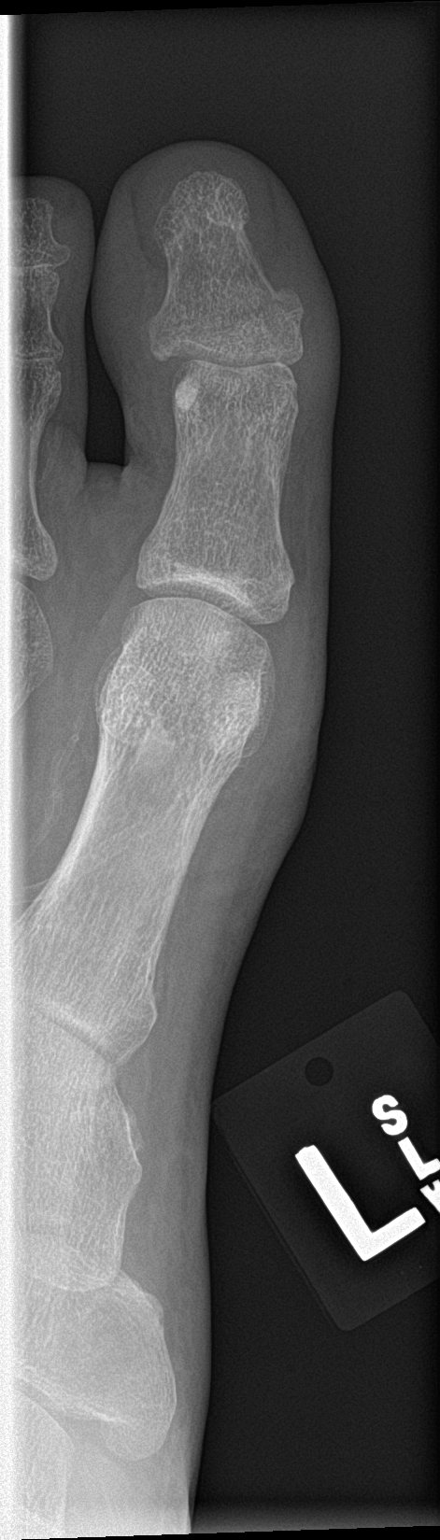

[toe obl]
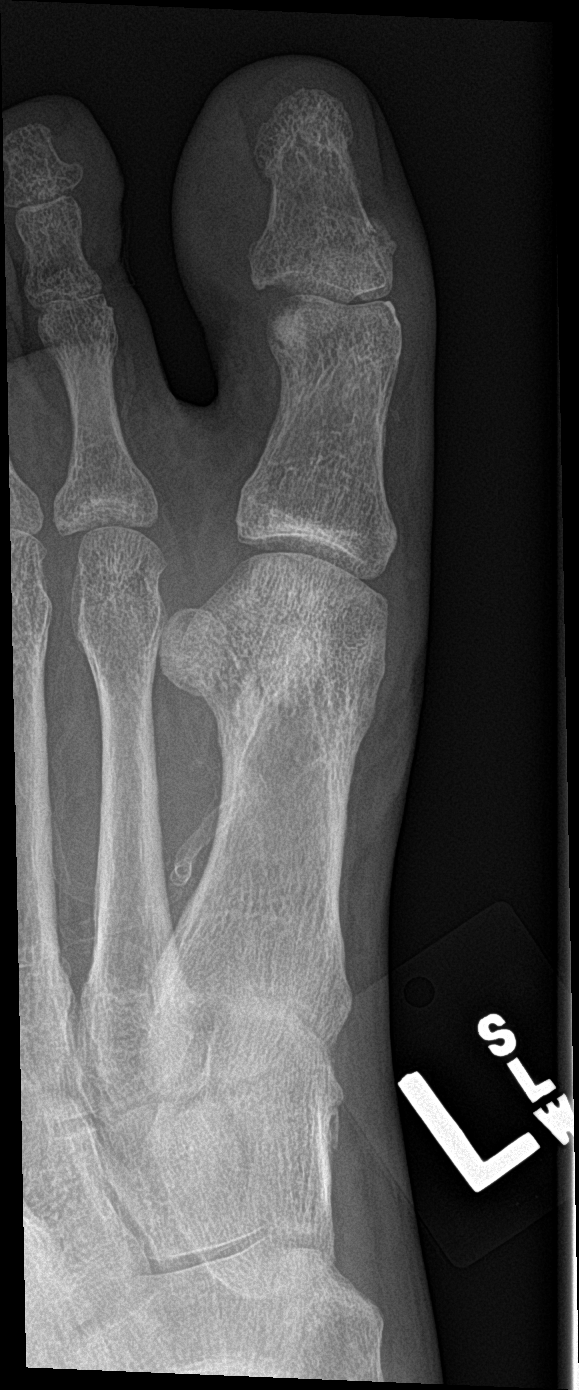

[toe lat]
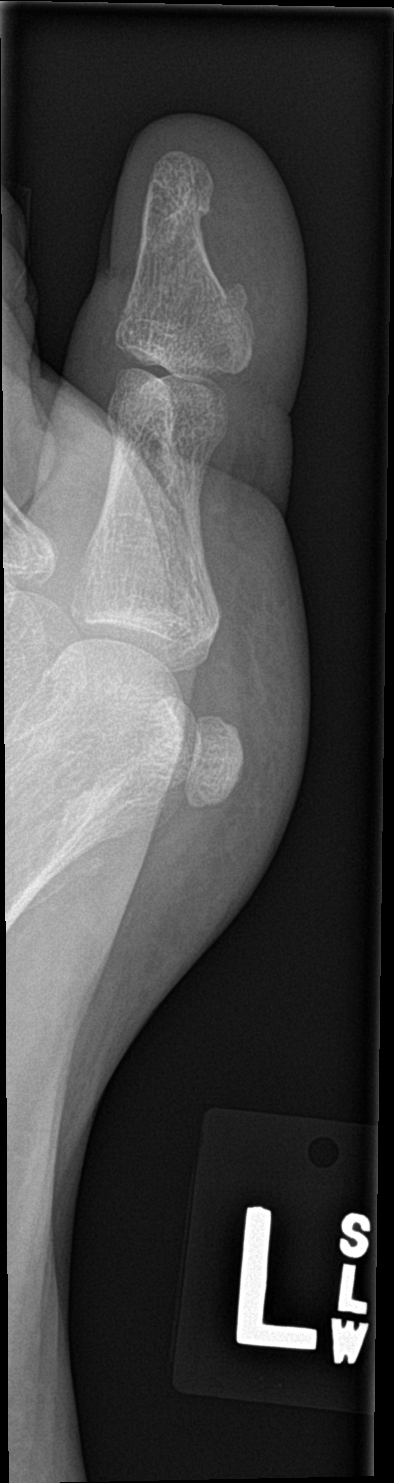

[3 of 3 positions shown; findings below may reference images not displayed]

FINDINGS: Linear lucency near the base of the left great toe distal phalanx
concerning for nondisplaced fracture which likely extends into the
IP joint. No subluxation or dislocation.
IMPRESSION: Concern for nondisplaced fracture through the base of the left great
toe distal phalanx.
